# Patient Record
Sex: Male | Born: 1970 | Race: Black or African American | Hispanic: No | Marital: Single | Smoking: Former smoker
Health system: Southern US, Community
[De-identification: ages and names within clinical notes are randomized; demographics above are authoritative.]

## PROBLEM LIST (undated history)

## (undated) DIAGNOSIS — E119 Type 2 diabetes mellitus without complications: Secondary | ICD-10-CM

---

## 2020-09-06 ENCOUNTER — Other Ambulatory Visit: Payer: Self-pay

## 2020-09-06 ENCOUNTER — Encounter (HOSPITAL_COMMUNITY): Payer: Self-pay | Admitting: *Deleted

## 2020-09-06 ENCOUNTER — Inpatient Hospital Stay (HOSPITAL_COMMUNITY)
Admission: EM | Admit: 2020-09-06 | Discharge: 2020-09-08 | DRG: 638 | Disposition: A | Discharge status: Home / Self Care | Attending: Internal Medicine | Admitting: Internal Medicine

## 2020-09-06 DIAGNOSIS — Z7984 Long term (current) use of oral hypoglycemic drugs: Secondary | ICD-10-CM

## 2020-09-06 DIAGNOSIS — E1110 Type 2 diabetes mellitus with ketoacidosis without coma: Secondary | ICD-10-CM | POA: Diagnosis not present

## 2020-09-06 DIAGNOSIS — N1832 Chronic kidney disease, stage 3b: Secondary | ICD-10-CM | POA: Diagnosis present

## 2020-09-06 DIAGNOSIS — E1122 Type 2 diabetes mellitus with diabetic chronic kidney disease: Secondary | ICD-10-CM | POA: Diagnosis present

## 2020-09-06 DIAGNOSIS — R112 Nausea with vomiting, unspecified: Secondary | ICD-10-CM | POA: Diagnosis not present

## 2020-09-06 DIAGNOSIS — N179 Acute kidney failure, unspecified: Principal | ICD-10-CM | POA: Diagnosis present

## 2020-09-06 DIAGNOSIS — I10 Essential (primary) hypertension: Secondary | ICD-10-CM | POA: Diagnosis not present

## 2020-09-06 DIAGNOSIS — E1165 Type 2 diabetes mellitus with hyperglycemia: Secondary | ICD-10-CM

## 2020-09-06 DIAGNOSIS — F32A Depression, unspecified: Secondary | ICD-10-CM | POA: Diagnosis present

## 2020-09-06 DIAGNOSIS — I129 Hypertensive chronic kidney disease with stage 1 through stage 4 chronic kidney disease, or unspecified chronic kidney disease: Secondary | ICD-10-CM | POA: Diagnosis present

## 2020-09-06 DIAGNOSIS — E785 Hyperlipidemia, unspecified: Secondary | ICD-10-CM | POA: Diagnosis present

## 2020-09-06 DIAGNOSIS — R739 Hyperglycemia, unspecified: Secondary | ICD-10-CM

## 2020-09-06 DIAGNOSIS — Z8249 Family history of ischemic heart disease and other diseases of the circulatory system: Secondary | ICD-10-CM

## 2020-09-06 DIAGNOSIS — N184 Chronic kidney disease, stage 4 (severe): Secondary | ICD-10-CM | POA: Diagnosis not present

## 2020-09-06 DIAGNOSIS — E11649 Type 2 diabetes mellitus with hypoglycemia without coma: Secondary | ICD-10-CM | POA: Diagnosis not present

## 2020-09-06 DIAGNOSIS — Z7982 Long term (current) use of aspirin: Secondary | ICD-10-CM

## 2020-09-06 DIAGNOSIS — E869 Volume depletion, unspecified: Secondary | ICD-10-CM | POA: Diagnosis not present

## 2020-09-06 DIAGNOSIS — Z87891 Personal history of nicotine dependence: Secondary | ICD-10-CM

## 2020-09-06 DIAGNOSIS — K219 Gastro-esophageal reflux disease without esophagitis: Secondary | ICD-10-CM | POA: Diagnosis present

## 2020-09-06 DIAGNOSIS — Z20822 Contact with and (suspected) exposure to covid-19: Secondary | ICD-10-CM | POA: Diagnosis present

## 2020-09-06 DIAGNOSIS — Z79899 Other long term (current) drug therapy: Secondary | ICD-10-CM

## 2020-09-06 DIAGNOSIS — F419 Anxiety disorder, unspecified: Secondary | ICD-10-CM | POA: Diagnosis present

## 2020-09-06 DIAGNOSIS — E871 Hypo-osmolality and hyponatremia: Secondary | ICD-10-CM | POA: Diagnosis present

## 2020-09-06 HISTORY — DX: Type 2 diabetes mellitus without complications: E11.9

## 2020-09-06 LAB — URINALYSIS, ROUTINE W REFLEX MICROSCOPIC
Bacteria, UA: NONE SEEN
Bilirubin Urine: NEGATIVE
Glucose, UA: >=500 mg/dL — AB
Ketones, ur: 5 mg/dL — AB
Leukocytes,Ua: NEGATIVE
Nitrite: NEGATIVE
Protein, ur: 30 mg/dL — AB
Specific Gravity, Urine: 1.012 (ref 1.005–1.030)
pH: 5 (ref 5.0–8.0)

## 2020-09-06 LAB — BASIC METABOLIC PANEL
Anion gap: 14 (ref 5–15)
BUN: 48 mg/dL — ABNORMAL HIGH (ref 6–20)
CO2: 24 mmol/L (ref 22–32)
Calcium: 9.8 mg/dL (ref 8.9–10.3)
Chloride: 93 mmol/L — ABNORMAL LOW (ref 98–111)
Creatinine, Ser: 3.18 mg/dL — ABNORMAL HIGH (ref 0.61–1.24)
GFR, Estimated: 23 mL/min — ABNORMAL LOW (ref >60)
Glucose, Bld: 197 mg/dL — ABNORMAL HIGH (ref 70–99)
Potassium: 4.3 mmol/L (ref 3.5–5.1)
Sodium: 131 mmol/L — ABNORMAL LOW (ref 135–145)

## 2020-09-06 LAB — COMPREHENSIVE METABOLIC PANEL
ALT: 14 U/L (ref 0–44)
AST: 19 U/L (ref 15–41)
Albumin: 5.4 g/dL — ABNORMAL HIGH (ref 3.5–5.0)
Alkaline Phosphatase: 92 U/L (ref 38–126)
Anion gap: 20 — ABNORMAL HIGH (ref 5–15)
BUN: 55 mg/dL — ABNORMAL HIGH (ref 6–20)
CO2: 21 mmol/L — ABNORMAL LOW (ref 22–32)
Calcium: 10.1 mg/dL (ref 8.9–10.3)
Chloride: 87 mmol/L — ABNORMAL LOW (ref 98–111)
Creatinine, Ser: 3.61 mg/dL — ABNORMAL HIGH (ref 0.61–1.24)
GFR, Estimated: 20 mL/min — ABNORMAL LOW (ref >60)
Glucose, Bld: 323 mg/dL — ABNORMAL HIGH (ref 70–99)
Potassium: 4.1 mmol/L (ref 3.5–5.1)
Sodium: 128 mmol/L — ABNORMAL LOW (ref 135–145)
Total Bilirubin: 1.1 mg/dL (ref 0.3–1.2)
Total Protein: 9.2 g/dL — ABNORMAL HIGH (ref 6.5–8.1)

## 2020-09-06 LAB — BLOOD GAS, VENOUS
Acid-Base Excess: 1.4 mmol/L (ref 0.0–2.0)
Bicarbonate: 24.3 mmol/L (ref 20.0–28.0)
FIO2: 21
O2 Saturation: 44.7 %
Patient temperature: 37
pCO2, Ven: 42.3 mmHg — ABNORMAL LOW (ref 44.0–60.0)
pH, Ven: 7.401 (ref 7.250–7.430)
pO2, Ven: <31 mmHg — CL (ref 32.0–45.0)

## 2020-09-06 LAB — CBC
HCT: 40.4 % (ref 39.0–52.0)
HCT: 41 % (ref 39.0–52.0)
Hemoglobin: 14.3 g/dL (ref 13.0–17.0)
Hemoglobin: 13.9 g/dL (ref 13.0–17.0)
MCH: 29.9 pg (ref 26.0–34.0)
MCH: 29.6 pg (ref 26.0–34.0)
MCHC: 35.4 g/dL (ref 30.0–36.0)
MCHC: 33.9 g/dL (ref 30.0–36.0)
MCV: 84.3 fL (ref 80.0–100.0)
MCV: 87.2 fL (ref 80.0–100.0)
Platelets: 337 10*3/uL (ref 150–400)
Platelets: 299 10*3/uL (ref 150–400)
RBC: 4.79 MIL/uL (ref 4.22–5.81)
RBC: 4.7 MIL/uL (ref 4.22–5.81)
RDW: 12 % (ref 11.5–15.5)
RDW: 12 % (ref 11.5–15.5)
WBC: 8.7 10*3/uL (ref 4.0–10.5)
WBC: 9.3 10*3/uL (ref 4.0–10.5)
nRBC: 0 % (ref 0.0–0.2)
nRBC: 0 % (ref 0.0–0.2)

## 2020-09-06 LAB — CBG MONITORING, ED
Glucose-Capillary: 122 mg/dL — ABNORMAL HIGH (ref 70–99)
Glucose-Capillary: 147 mg/dL — ABNORMAL HIGH (ref 70–99)
Glucose-Capillary: 153 mg/dL — ABNORMAL HIGH (ref 70–99)
Glucose-Capillary: 155 mg/dL — ABNORMAL HIGH (ref 70–99)
Glucose-Capillary: 209 mg/dL — ABNORMAL HIGH (ref 70–99)
Glucose-Capillary: 341 mg/dL — ABNORMAL HIGH (ref 70–99)

## 2020-09-06 LAB — RESP PANEL BY RT-PCR (FLU A&B, COVID) ARPGX2
Influenza A by PCR: NEGATIVE
Influenza B by PCR: NEGATIVE
SARS Coronavirus 2 by RT PCR: NEGATIVE

## 2020-09-06 LAB — BETA-HYDROXYBUTYRIC ACID
Beta-Hydroxybutyric Acid: 2.07 mmol/L — ABNORMAL HIGH (ref 0.05–0.27)
Beta-Hydroxybutyric Acid: 1.35 mmol/L — ABNORMAL HIGH (ref 0.05–0.27)

## 2020-09-06 LAB — LIPASE, BLOOD: Lipase: 35 U/L (ref 11–51)

## 2020-09-06 MED ORDER — PANTOPRAZOLE SODIUM 40 MG PO TBEC: 40.0000 mg | DELAYED_RELEASE_TABLET | Freq: Every day | ORAL | Status: DC

## 2020-09-06 MED ORDER — DEXTROSE 50 % IV SOLN: 0.0000 mL | INTRAVENOUS | Status: DC | PRN

## 2020-09-06 MED ORDER — DULOXETINE HCL 20 MG PO CPEP
20.0000 mg | ORAL_CAPSULE | Freq: Every day | ORAL | Status: DC
  Administered 2020-09-06 – 2020-09-07 (×2): 20 mg via ORAL
  Filled 2020-09-06 (×2): qty 1

## 2020-09-06 MED ORDER — SODIUM CHLORIDE 0.9% FLUSH
3.0000 mL | Freq: Two times a day (BID) | INTRAVENOUS | Status: DC
  Administered 2020-09-06: 23:00:00 3 mL via INTRAVENOUS

## 2020-09-06 MED ORDER — SODIUM CHLORIDE 0.9 % IV BOLUS
1000.0000 mL | Freq: Once | INTRAVENOUS | Status: AC
  Administered 2020-09-06: 15:00:00 1000 mL via INTRAVENOUS

## 2020-09-06 MED ORDER — ONDANSETRON HCL 4 MG/2ML IJ SOLN
4.0000 mg | Freq: Once | INTRAMUSCULAR | Status: AC
  Administered 2020-09-06: 15:00:00 4 mg via INTRAVENOUS
  Filled 2020-09-06: qty 2

## 2020-09-06 MED ORDER — ACETAMINOPHEN 650 MG RE SUPP: 650.0000 mg | Freq: Four times a day (QID) | RECTAL | Status: DC | PRN

## 2020-09-06 MED ORDER — ASPIRIN EC 81 MG PO TBEC
81.0000 mg | DELAYED_RELEASE_TABLET | Freq: Every day | ORAL | Status: DC
  Administered 2020-09-07 – 2020-09-08 (×2): 81 mg via ORAL
  Filled 2020-09-06 (×2): qty 1

## 2020-09-06 MED ORDER — ONDANSETRON HCL 4 MG/2ML IJ SOLN
4.0000 mg | Freq: Once | INTRAMUSCULAR | Status: AC
  Administered 2020-09-06: 18:00:00 4 mg via INTRAVENOUS
  Filled 2020-09-06: qty 2

## 2020-09-06 MED ORDER — POTASSIUM CHLORIDE 10 MEQ/100ML IV SOLN
10.0000 meq | INTRAVENOUS | Status: AC
  Administered 2020-09-06 (×2): 10 meq via INTRAVENOUS
  Filled 2020-09-06 (×2): qty 100

## 2020-09-06 MED ORDER — INSULIN REGULAR(HUMAN) IN NACL 100-0.9 UT/100ML-% IV SOLN
INTRAVENOUS | Status: DC
  Administered 2020-09-06: 5 [IU]/h via INTRAVENOUS
  Filled 2020-09-06: qty 100

## 2020-09-06 MED ORDER — SODIUM CHLORIDE 0.9% FLUSH: 3.0000 mL | INTRAVENOUS | Status: DC | PRN

## 2020-09-06 MED ORDER — SODIUM CHLORIDE 0.9% FLUSH
3.0000 mL | Freq: Two times a day (BID) | INTRAVENOUS | Status: DC
  Administered 2020-09-06 – 2020-09-07 (×2): 3 mL via INTRAVENOUS

## 2020-09-06 MED ORDER — ACETAMINOPHEN 325 MG PO TABS: 650.0000 mg | ORAL_TABLET | Freq: Four times a day (QID) | ORAL | Status: DC | PRN

## 2020-09-06 MED ORDER — SIMVASTATIN 20 MG PO TABS
40.0000 mg | ORAL_TABLET | Freq: Every day | ORAL | Status: DC
  Administered 2020-09-06 – 2020-09-07 (×2): 40 mg via ORAL
  Filled 2020-09-06: qty 4
  Filled 2020-09-06: qty 2

## 2020-09-06 MED ORDER — DEXTROSE IN LACTATED RINGERS 5 % IV SOLN: INTRAVENOUS | Status: DC

## 2020-09-06 MED ORDER — SENNOSIDES-DOCUSATE SODIUM 8.6-50 MG PO TABS
2.0000 | ORAL_TABLET | Freq: Every day | ORAL | Status: DC
  Administered 2020-09-07: 21:00:00 2 via ORAL
  Filled 2020-09-06: qty 2

## 2020-09-06 MED ORDER — PROMETHAZINE HCL 25 MG/ML IJ SOLN
12.5000 mg | Freq: Once | INTRAMUSCULAR | Status: AC
  Administered 2020-09-06: 19:00:00 12.5 mg via INTRAVENOUS
  Filled 2020-09-06: qty 1

## 2020-09-06 MED ORDER — BISACODYL 10 MG RE SUPP: 10.0000 mg | Freq: Every day | RECTAL | Status: DC | PRN

## 2020-09-06 MED ORDER — SODIUM CHLORIDE 0.9 % IV BOLUS
1000.0000 mL | Freq: Once | INTRAVENOUS | Status: AC
  Administered 2020-09-06: 19:00:00 1000 mL via INTRAVENOUS

## 2020-09-06 MED ORDER — AMLODIPINE BESYLATE 5 MG PO TABS
10.0000 mg | ORAL_TABLET | Freq: Every day | ORAL | Status: DC
  Administered 2020-09-07 – 2020-09-08 (×2): 10 mg via ORAL
  Filled 2020-09-06 (×2): qty 2

## 2020-09-06 MED ORDER — HEPARIN SODIUM (PORCINE) 5000 UNIT/ML IJ SOLN
5000.0000 [IU] | Freq: Three times a day (TID) | INTRAMUSCULAR | Status: DC
  Administered 2020-09-06 – 2020-09-08 (×5): 5000 [IU] via SUBCUTANEOUS
  Filled 2020-09-06 (×5): qty 1

## 2020-09-06 MED ORDER — POLYETHYLENE GLYCOL 3350 17 G PO PACK: 17.0000 g | PACK | Freq: Every day | ORAL | Status: DC | PRN

## 2020-09-06 MED ORDER — ONDANSETRON HCL 4 MG/2ML IJ SOLN
4.0000 mg | Freq: Four times a day (QID) | INTRAMUSCULAR | Status: DC | PRN
  Administered 2020-09-07: 03:00:00 4 mg via INTRAVENOUS
  Filled 2020-09-06: qty 2

## 2020-09-06 MED ORDER — SODIUM CHLORIDE 0.9 % IV SOLN: 250.0000 mL | INTRAVENOUS | Status: DC | PRN

## 2020-09-06 MED ORDER — ONDANSETRON HCL 4 MG PO TABS: 4.0000 mg | ORAL_TABLET | Freq: Four times a day (QID) | ORAL | Status: DC | PRN

## 2020-09-06 MED ORDER — HEPARIN SODIUM (PORCINE) 5000 UNIT/ML IJ SOLN: 5000.0000 [IU] | Freq: Three times a day (TID) | INTRAMUSCULAR | Status: DC

## 2020-09-06 MED ORDER — LACTATED RINGERS IV SOLN: INTRAVENOUS | Status: DC

## 2020-09-06 NOTE — ED Provider Notes (Addendum)
[facility] EMERGENCY DEPARTMENT Provider Note   CSN: [id number] Arrival date & time: 09/06/20  1039     History Chief Complaint  Patient presents with  . Emesis    Jesse Carey is a 49 y.o. male  with a history of non insulin dependent DM, HTN, hypercholesterolemia and GERD presenting a one week history of uncontrolled nausea and vomiting with increased weakness and intermittent chills.  He denies chest pain, sob, cough, also no abdominal pain and no diarrhea.  He was tested for Covid by the prison RN prior to transport here, pt stating this test was negative.  He endorses having taken his prescribed medications but his blood glucose levels have not been well controlled, as high as 500 this past week. He endorses increased urinary frequency, dry mouth, thirst, but has had very little urine production today.  No history of kidney problems.   He has found no alleviators for his symptoms.  HPI     Past Medical History:  Diagnosis Date  . Diabetes mellitus without complication ([place])     There are no problems to display for this patient.   History reviewed. No pertinent surgical history.     History reviewed. No pertinent family history.  Social History   Tobacco Use  . Smoking status: Former Research scientist (life sciences)  . Smokeless tobacco: Never Used    Home Medications Prior to Admission medications   Medication Sig Start Date End Date Taking? Authorizing Provider  amLODipine (NORVASC) 10 MG tablet Take 10 mg by mouth daily.   Yes [provider]  aspirin EC 81 MG tablet Take 81 mg by mouth daily. Swallow whole.   Yes [provider]  Calcium Polycarbophil (FIBER) 625 MG TABS Take 625 mg by mouth daily. With 2 glasses of water   Yes [provider]  chlorthalidone (HYGROTON) 25 MG tablet Take 25 mg by mouth daily.   Yes [provider]  docusate sodium (COLACE) 100 MG capsule Take 100 mg by mouth 2 (two) times daily.   Yes [provider]   DULoxetine (CYMBALTA) 20 MG capsule Take 20 mg by mouth at bedtime.   Yes [provider]  glipiZIDE (GLUCOTROL XL) 10 MG 24 hr tablet Take 10 mg by mouth daily.   Yes [provider]  losartan (COZAAR) 25 MG tablet Take 25 mg by mouth daily.   Yes [provider]  metFORMIN (GLUCOPHAGE-XR) 750 MG 24 hr tablet Take 750 mg by mouth in the morning and at bedtime.   Yes [provider]  omeprazole (PRILOSEC) 20 MG capsule Take 20 mg by mouth at bedtime.   Yes [provider]  simvastatin (ZOCOR) 40 MG tablet Take 40 mg by mouth at bedtime.   Yes [provider]  terbinafine (LAMISIL) 250 MG tablet Take 250 mg by mouth daily.   Yes [provider]    Allergies    Patient has no known allergies.  Review of Systems   Review of Systems  Constitutional: Positive for chills and fatigue. Negative for fever.  HENT: Negative for congestion.   Eyes: Negative.   Respiratory: Negative for chest tightness and shortness of breath.   Cardiovascular: Negative for chest pain.  Gastrointestinal: Positive for nausea and vomiting. Negative for abdominal pain.  Genitourinary: Positive for decreased urine volume.  Musculoskeletal: Negative for arthralgias, joint swelling and neck pain.  Skin: Negative.  Negative for rash and wound.  Neurological: Negative for dizziness, weakness, light-headedness, numbness and headaches.  Psychiatric/Behavioral: Negative.  Physical Exam Updated Vital Signs BP 132/85   Pulse 99   Temp 98.6 F (37 C) (Oral)   Resp (!) 23   Ht 5\' 8"  (1.727 m)   Wt 80.3 kg   SpO2 97%   BMI 26.91 kg/m   Physical Exam Vitals and nursing note reviewed.  Constitutional:      Appearance: He is well-developed and well-nourished.  HENT:     Head: Normocephalic and atraumatic.     Mouth/Throat:     Mouth: Mucous membranes are dry.  Eyes:     Conjunctiva/sclera: Conjunctivae normal.  Cardiovascular:     Rate and Rhythm:  Normal rate and regular rhythm.     Pulses: Intact distal pulses.     Heart sounds: Normal heart sounds.  Pulmonary:     Effort: Pulmonary effort is normal.     Breath sounds: Normal breath sounds. No wheezing.  Abdominal:     General: Bowel sounds are normal.     Palpations: Abdomen is soft.     Tenderness: There is no abdominal tenderness. There is no guarding.  Musculoskeletal:        General: Normal range of motion.     Cervical back: Normal range of motion.     Right lower leg: No edema.     Left lower leg: No edema.  Skin:    General: Skin is warm and dry.  Neurological:     General: No focal deficit present.     Mental Status: He is alert.  Psychiatric:        Mood and Affect: Mood and affect normal.     ED Results / Procedures / Treatments   Labs (all labs ordered are listed, but only abnormal results are displayed) Labs Reviewed  COMPREHENSIVE METABOLIC PANEL - Abnormal; Notable for the following components:      Result Value   Sodium 128 (*)    Chloride 87 (*)    CO2 21 (*)    Glucose, Bld 323 (*)    BUN 55 (*)    Creatinine, Ser 3.61 (*)    Total Protein 9.2 (*)    Albumin 5.4 (*)    GFR, Estimated 20 (*)    Anion gap 20 (*)    All other components within normal limits  BLOOD GAS, VENOUS - Abnormal; Notable for the following components:   pCO2, Ven 42.3 (*)    pO2, Ven <31.0 (*)    All other components within normal limits  CBG MONITORING, ED - Abnormal; Notable for the following components:   Glucose-Capillary 341 (*)    All other components within normal limits  RESP PANEL BY RT-PCR (FLU A&B, COVID) ARPGX2  LIPASE, BLOOD  CBC  URINALYSIS, ROUTINE W REFLEX MICROSCOPIC  BETA-HYDROXYBUTYRIC ACID    EKG None  Radiology No results found.  Procedures Procedures (including critical care time)  Medications Ordered in ED Medications  sodium chloride 0.9 % bolus 1,000 mL (0 mLs Intravenous Stopped 09/06/20 1650)  ondansetron (ZOFRAN) injection 4  mg (4 mg Intravenous Given 09/06/20 1518)  ondansetron (ZOFRAN) injection 4 mg (4 mg Intravenous Given 09/06/20 1807)    ED Course  I have reviewed the triage vital signs and the nursing notes.  Pertinent labs & imaging results that were available during my care of the patient were reviewed by me and considered in my medical decision making (see chart for details).    MDM Rules/Calculators/A&P  Pt given IV fluids, pending vbg and beta hydroxybutryric to differential dka.  vbg with a normal pH,  Borderline low CO2,  Not dka.  Significant ARF with creatinine of 3.61, no prior to compare.  BUN 55 suggesting dehydration. Pt will require admission.  Corrected Na+ 132, mild hyponatremia  6:35 PM   cbg improved at 209. Still unable to urinate, states has not urinated since yesterday.  Bladder scan 830 mL, pt is making urine. Additional IV NS ordered.  He provided additional info stating he has a "shy bladder".  Pt admitted to hospitalist service, Dr. Denton Brick   Final Clinical Impression(s) / ED Diagnoses Final diagnoses:  Acute renal failure, unspecified acute renal failure type ([place])  Nausea and vomiting, intractability of vomiting not specified, unspecified vomiting type  Hyperglycemia    Rx / DC Orders ED Discharge Orders    None       Landis Martins 09/06/20 Moody Bruins, PA-C 09/06/20 1849    Truddie Hidden, MD 09/07/20 0701    Evalee Jefferson, PA-C 09/07/20 1207    Truddie Hidden, MD 09/07/20 1343

## 2020-09-06 NOTE — ED Notes (Signed)
Pt unable to give urine sample at this time 

## 2020-09-06 NOTE — ED Notes (Signed)
Pt still unable to give urine sample at this time.

## 2020-09-06 NOTE — H&P (Signed)
Patient Demographics:    Jesse Carey, is a 49 y.o. male  MRN: [id number]   DOB - 1971-07-03  Admit Date - 09/06/2020  Outpatient Primary MD for the patient is Pcp, No   Assessment & Plan:    Principal Problem:   DKA (diabetic ketoacidosis) ([place]) Active Problems:   CKD (chronic kidney disease), stage IV ([facility])   Hyponatremia   HTN (hypertension)   GERD (gastroesophageal reflux disease)   HLD (hyperlipidemia)   A/p 1)Hyperglycemia with anion gap metabolic acidosis suspect some component of diabetic Ketoacidosis ---  Beta-Hydroxybutyric Acid is 2.0 Glucose 341 Bicarb 21, AG is 20 VBG noted -Continue IV fluids and IV insulin per Endo tool protocol -Diabetic educator consult requested -A1c pending  2)Abd Pain/Nausea/Dry Heaves---  Lipase 35, suspect DKA related --Trial of oral intake -As needed antiemetics and PPI as ordered  3)CKD IV Vs Aki on CKD --- Creatinine is 3.61, no prior records/prior creatinine to evaluate -Patient has been diabetic since 2013, had longstanding HTN I suspect some component of CKD -Hold losartan and chlorthalidone due to kidney concerns  4) hyponatremia--- sodium is 128, suspect due to dehydration in setting of nausea, dry heaves and poor oral intake compounded by chlorthalidone use --- patient received normal saline in the ED -Stop chlorthalidone -Avoid dehydration, continue to hydrate  5)HTN--- -Hold losartan and chlorthalidone due to kidney concerns -Continue amlodipine 10 mg daily  6)HLD-continue simvastatin  7)GERD--- continue PPI  8) social--- patient is from correctional facility, patient has been at correctional facility for 49 years not since age 31, apparently scheduled to be released in 3 and half years when he is 49 years old--- he had a 20-year  sentence  Disposition/Need for in-Hospital Stay- patient unable to be discharged at this time due to --- hyperglycemia with anion gap metabolic acidosis with inability to take oral intake requiring IV fluids and IV insulin  Dispo: The patient is from: Correctional facility              Anticipated d/c is to: Correctional facility              Anticipated d/c date is: 1 day              Patient currently is not medically stable to d/c. Barriers: Not Clinically Stable-    With History of - Reviewed by me  Past Medical History:  Diagnosis Date  . Diabetes mellitus without complication ([place])       History reviewed. No pertinent surgical history.    Chief Complaint  Patient presents with  . Emesis      HPI:    Jesse Carey  is a 49 y.o. male with past medical history relevant for HTN, GERD and HLD, as well as diabetes mellitus diagnosed back in 2013, presumed CKD unknown stage who presents from correctional facility with complaints of generalized malaise, fatigue, generalized weakness, nausea with dry heaving over the last 5 to  7 days -No diarrhea, no sick contacts -No fevers no chills no palpitations no chest pains no shortness of breath no dizziness no pleuritic symptoms no leg pains -No cough -Patient reports compliance with Metformin and glipizide PTA -Patient denies recent change in his diet or habits  --Designer, industrial/product at bedside- - In ED Labs--reflect anion gap metabolic acidosis suspect some component of diabetic Ketoacidosis ---  Beta-Hydroxybutyric Acid is 2.0 Glucose 341 Bicarb 21, AG is 20 VBG noted  Lipase 35, --- Creatinine is 3.61 -Sodium is low at 128  -EDP gave IV fluids, -IV insulin via Endo tool initiated in the ED -   Review of systems:    In addition to the HPI above,   A full Review of  Systems was done, all other systems reviewed are negative except as noted above in HPI , .   Social History:  Reviewed by me    Social History    Tobacco Use  . Smoking status: Former Research scientist (life sciences)  . Smokeless tobacco: Never Used  Substance Use Topics  . Alcohol use: Not on file     Family History :  Reviewed by me  HTN   Home Medications:   Prior to Admission medications   Medication Sig Start Date End Date Taking? Authorizing Provider  amLODipine (NORVASC) 10 MG tablet Take 10 mg by mouth daily.   Yes [provider]  aspirin EC 81 MG tablet Take 81 mg by mouth daily. Swallow whole.   Yes [provider]  Calcium Polycarbophil (FIBER) 625 MG TABS Take 625 mg by mouth daily. With 2 glasses of water   Yes [provider]  chlorthalidone (HYGROTON) 25 MG tablet Take 25 mg by mouth daily.   Yes [provider]  docusate sodium (COLACE) 100 MG capsule Take 100 mg by mouth 2 (two) times daily.   Yes [provider]  DULoxetine (CYMBALTA) 20 MG capsule Take 20 mg by mouth at bedtime.   Yes [provider]  glipiZIDE (GLUCOTROL XL) 10 MG 24 hr tablet Take 10 mg by mouth daily.   Yes [provider]  losartan (COZAAR) 25 MG tablet Take 25 mg by mouth daily.   Yes [provider]  metFORMIN (GLUCOPHAGE-XR) 750 MG 24 hr tablet Take 750 mg by mouth in the morning and at bedtime.   Yes [provider]  omeprazole (PRILOSEC) 20 MG capsule Take 20 mg by mouth at bedtime.   Yes [provider]  simvastatin (ZOCOR) 40 MG tablet Take 40 mg by mouth at bedtime.   Yes [provider]  terbinafine (LAMISIL) 250 MG tablet Take 250 mg by mouth daily.   Yes [provider]     Allergies:    No Known Allergies   Physical Exam:   Vitals  Blood pressure (!) 147/91, pulse 94, temperature 98.6 F (37 C), temperature source Oral, resp. rate 16, height 5\' 8"  (1.727 m), weight 80.3 kg, SpO2 97 %.  Physical Examination: General appearance - alert, and in no distress  Mental status - alert, oriented to person, place, and time,  Eyes - sclera  anicteric Neck - supple, no JVD elevation , Chest - clear  to auscultation bilaterally, symmetrical air movement,  Heart - S1 and S2 normal, regular  Abdomen - soft, nontender, nondistended, no masses or organomegaly Neurological - screening mental status exam normal, neck supple without rigidity, cranial nerves II through XII intact, DTR's normal and symmetric Extremities - no pedal edema noted, intact  peripheral pulses Skin - warm, dry    Data Review:    CBC Recent Labs  Lab 09/06/20 1158  WBC 8.7  HGB 14.3  HCT 40.4  PLT 337  MCV 84.3  MCH 29.9  MCHC 35.4  RDW 12.0    Chemistries  Recent Labs  Lab 09/06/20 1158  NA 128*  K 4.1  CL 87*  CO2 21*  GLUCOSE 323*  BUN 55*  CREATININE 3.61*  CALCIUM 10.1  AST 19  ALT 14  ALKPHOS 92  BILITOT 1.1   ------------------------------------------------------------------------------------------------------------------ estimated creatinine clearance is 23.7 mL/min (A) (by C-G formula based on SCr of 3.61 mg/dL (H)). ------------------------------------------------------------------------------------------------------------------ No results for input(s): TSH, T4TOTAL, T3FREE, THYROIDAB in the last 72 hours.  Invalid input(s): FREET3   Coagulation profile No results for input(s): INR, PROTIME in the last 168 hours. ------------------------------------------------------------------------------------------------------------------- No results for input(s): DDIMER in the last 72 hours. -------------------------------------------------------------------------------------------------------------------  Cardiac Enzymes No results for input(s): CKMB, TROPONINI, MYOGLOBIN in the last 168 hours.  Invalid input(s): CK ------------------------------------------------------------------------------------------------------------------ No results found for:  BNP   ---------------------------------------------------------------------------------------------------------------  Urinalysis No results found for: COLORURINE, APPEARANCEUR, LABSPEC, [place], GLUCOSEU, HGBUR, BILIRUBINUR, KETONESUR, PROTEINUR, UROBILINOGEN, NITRITE, LEUKOCYTESUR  ----------------------------------------------------------------------------------------------------------------   Imaging Results:    No results found.  Radiological Exams on Admission: No results found.  DVT Prophylaxis -SCD/heparin AM Labs Ordered, also please review Full Orders  Family Communication: Admission, patients condition and plan of care including tests being ordered have been discussed with the patient who indicate understanding and agree with the plan   Code Status - Full Code  Likely DC to back to correctional facility  Condition   stable  Roxan Hockey M.D on 09/06/2020 at 7:24 PM Go to [email or web address] -  for contact info  [facility] - Office  [phone]

## 2020-09-06 NOTE — ED Notes (Signed)
Call from lab   Critical Venous PO2 of less than 31.0   JI, [place] informed

## 2020-09-06 NOTE — ED Triage Notes (Signed)
Pt c/o chills, emesis and gen. Weakness since last Friday.  Unknown of any fevers.

## 2020-09-07 ENCOUNTER — Observation Stay (HOSPITAL_COMMUNITY)

## 2020-09-07 DIAGNOSIS — Z7984 Long term (current) use of oral hypoglycemic drugs: Secondary | ICD-10-CM | POA: Diagnosis not present

## 2020-09-07 DIAGNOSIS — E871 Hypo-osmolality and hyponatremia: Secondary | ICD-10-CM

## 2020-09-07 DIAGNOSIS — N1832 Chronic kidney disease, stage 3b: Secondary | ICD-10-CM

## 2020-09-07 DIAGNOSIS — E1110 Type 2 diabetes mellitus with ketoacidosis without coma: Secondary | ICD-10-CM | POA: Diagnosis present

## 2020-09-07 DIAGNOSIS — E785 Hyperlipidemia, unspecified: Secondary | ICD-10-CM | POA: Diagnosis present

## 2020-09-07 DIAGNOSIS — Z8249 Family history of ischemic heart disease and other diseases of the circulatory system: Secondary | ICD-10-CM | POA: Diagnosis not present

## 2020-09-07 DIAGNOSIS — N179 Acute kidney failure, unspecified: Secondary | ICD-10-CM

## 2020-09-07 DIAGNOSIS — Z7982 Long term (current) use of aspirin: Secondary | ICD-10-CM | POA: Diagnosis not present

## 2020-09-07 DIAGNOSIS — F32A Depression, unspecified: Secondary | ICD-10-CM | POA: Diagnosis present

## 2020-09-07 DIAGNOSIS — E1122 Type 2 diabetes mellitus with diabetic chronic kidney disease: Secondary | ICD-10-CM | POA: Diagnosis present

## 2020-09-07 DIAGNOSIS — Z79899 Other long term (current) drug therapy: Secondary | ICD-10-CM | POA: Diagnosis not present

## 2020-09-07 DIAGNOSIS — Z20822 Contact with and (suspected) exposure to covid-19: Secondary | ICD-10-CM | POA: Diagnosis present

## 2020-09-07 DIAGNOSIS — E1165 Type 2 diabetes mellitus with hyperglycemia: Secondary | ICD-10-CM | POA: Diagnosis not present

## 2020-09-07 DIAGNOSIS — F419 Anxiety disorder, unspecified: Secondary | ICD-10-CM | POA: Diagnosis present

## 2020-09-07 DIAGNOSIS — Z87891 Personal history of nicotine dependence: Secondary | ICD-10-CM | POA: Diagnosis not present

## 2020-09-07 DIAGNOSIS — E11649 Type 2 diabetes mellitus with hypoglycemia without coma: Secondary | ICD-10-CM | POA: Diagnosis not present

## 2020-09-07 DIAGNOSIS — E782 Mixed hyperlipidemia: Secondary | ICD-10-CM | POA: Diagnosis not present

## 2020-09-07 DIAGNOSIS — I129 Hypertensive chronic kidney disease with stage 1 through stage 4 chronic kidney disease, or unspecified chronic kidney disease: Secondary | ICD-10-CM | POA: Diagnosis present

## 2020-09-07 DIAGNOSIS — K219 Gastro-esophageal reflux disease without esophagitis: Secondary | ICD-10-CM | POA: Diagnosis present

## 2020-09-07 DIAGNOSIS — E869 Volume depletion, unspecified: Secondary | ICD-10-CM | POA: Diagnosis not present

## 2020-09-07 DIAGNOSIS — R112 Nausea with vomiting, unspecified: Secondary | ICD-10-CM | POA: Diagnosis present

## 2020-09-07 LAB — BASIC METABOLIC PANEL
Anion gap: 12 (ref 5–15)
Anion gap: 11 (ref 5–15)
Anion gap: 12 (ref 5–15)
BUN: 44 mg/dL — ABNORMAL HIGH (ref 6–20)
BUN: 36 mg/dL — ABNORMAL HIGH (ref 6–20)
BUN: 32 mg/dL — ABNORMAL HIGH (ref 6–20)
CO2: 27 mmol/L (ref 22–32)
CO2: 25 mmol/L (ref 22–32)
CO2: 26 mmol/L (ref 22–32)
Calcium: 9.6 mg/dL (ref 8.9–10.3)
Calcium: 9.8 mg/dL (ref 8.9–10.3)
Calcium: 9.5 mg/dL (ref 8.9–10.3)
Chloride: 95 mmol/L — ABNORMAL LOW (ref 98–111)
Chloride: 97 mmol/L — ABNORMAL LOW (ref 98–111)
Chloride: 95 mmol/L — ABNORMAL LOW (ref 98–111)
Creatinine, Ser: 2.93 mg/dL — ABNORMAL HIGH (ref 0.61–1.24)
Creatinine, Ser: 2.65 mg/dL — ABNORMAL HIGH (ref 0.61–1.24)
Creatinine, Ser: 2.54 mg/dL — ABNORMAL HIGH (ref 0.61–1.24)
GFR, Estimated: 25 mL/min — ABNORMAL LOW (ref >60)
GFR, Estimated: 28 mL/min — ABNORMAL LOW (ref >60)
GFR, Estimated: 30 mL/min — ABNORMAL LOW (ref >60)
Glucose, Bld: 144 mg/dL — ABNORMAL HIGH (ref 70–99)
Glucose, Bld: 179 mg/dL — ABNORMAL HIGH (ref 70–99)
Glucose, Bld: 174 mg/dL — ABNORMAL HIGH (ref 70–99)
Potassium: 4 mmol/L (ref 3.5–5.1)
Potassium: 3.9 mmol/L (ref 3.5–5.1)
Potassium: 3.9 mmol/L (ref 3.5–5.1)
Sodium: 134 mmol/L — ABNORMAL LOW (ref 135–145)
Sodium: 133 mmol/L — ABNORMAL LOW (ref 135–145)
Sodium: 133 mmol/L — ABNORMAL LOW (ref 135–145)

## 2020-09-07 LAB — COMPREHENSIVE METABOLIC PANEL
ALT: 12 U/L (ref 0–44)
AST: 18 U/L (ref 15–41)
Albumin: 4.6 g/dL (ref 3.5–5.0)
Alkaline Phosphatase: 80 U/L (ref 38–126)
Anion gap: 11 (ref 5–15)
BUN: 40 mg/dL — ABNORMAL HIGH (ref 6–20)
CO2: 27 mmol/L (ref 22–32)
Calcium: 9.8 mg/dL (ref 8.9–10.3)
Chloride: 97 mmol/L — ABNORMAL LOW (ref 98–111)
Creatinine, Ser: 2.89 mg/dL — ABNORMAL HIGH (ref 0.61–1.24)
GFR, Estimated: 26 mL/min — ABNORMAL LOW (ref >60)
Glucose, Bld: 143 mg/dL — ABNORMAL HIGH (ref 70–99)
Potassium: 4 mmol/L (ref 3.5–5.1)
Sodium: 135 mmol/L (ref 135–145)
Total Bilirubin: 1.1 mg/dL (ref 0.3–1.2)
Total Protein: 7.9 g/dL (ref 6.5–8.1)

## 2020-09-07 LAB — GLUCOSE, CAPILLARY
Glucose-Capillary: 130 mg/dL — ABNORMAL HIGH (ref 70–99)
Glucose-Capillary: 137 mg/dL — ABNORMAL HIGH (ref 70–99)

## 2020-09-07 LAB — HIV ANTIBODY (ROUTINE TESTING W REFLEX): HIV Screen 4th Generation wRfx: NONREACTIVE

## 2020-09-07 LAB — LIPID PANEL
Cholesterol: 269 mg/dL — ABNORMAL HIGH (ref 0–200)
HDL: 33 mg/dL — ABNORMAL LOW (ref >40)
LDL Cholesterol: 205 mg/dL — ABNORMAL HIGH (ref 0–99)
Total CHOL/HDL Ratio: 8.2 RATIO
Triglycerides: 157 mg/dL — ABNORMAL HIGH (ref <150)
VLDL: 31 mg/dL (ref 0–40)

## 2020-09-07 LAB — CBC
HCT: 38.7 % — ABNORMAL LOW (ref 39.0–52.0)
Hemoglobin: 13.1 g/dL (ref 13.0–17.0)
MCH: 29.5 pg (ref 26.0–34.0)
MCHC: 33.9 g/dL (ref 30.0–36.0)
MCV: 87.2 fL (ref 80.0–100.0)
Platelets: 301 10*3/uL (ref 150–400)
RBC: 4.44 MIL/uL (ref 4.22–5.81)
RDW: 12.1 % (ref 11.5–15.5)
WBC: 10 10*3/uL (ref 4.0–10.5)
nRBC: 0 % (ref 0.0–0.2)

## 2020-09-07 LAB — CBG MONITORING, ED
Glucose-Capillary: 127 mg/dL — ABNORMAL HIGH (ref 70–99)
Glucose-Capillary: 140 mg/dL — ABNORMAL HIGH (ref 70–99)
Glucose-Capillary: 152 mg/dL — ABNORMAL HIGH (ref 70–99)
Glucose-Capillary: 166 mg/dL — ABNORMAL HIGH (ref 70–99)
Glucose-Capillary: 167 mg/dL — ABNORMAL HIGH (ref 70–99)
Glucose-Capillary: 180 mg/dL — ABNORMAL HIGH (ref 70–99)
Glucose-Capillary: 195 mg/dL — ABNORMAL HIGH (ref 70–99)
Glucose-Capillary: 228 mg/dL — ABNORMAL HIGH (ref 70–99)

## 2020-09-07 LAB — HEMOGLOBIN A1C
Hgb A1c MFr Bld: 10.7 % — ABNORMAL HIGH (ref 4.8–5.6)
Mean Plasma Glucose: 260.39 mg/dL

## 2020-09-07 LAB — BETA-HYDROXYBUTYRIC ACID: Beta-Hydroxybutyric Acid: 0.11 mmol/L (ref 0.05–0.27)

## 2020-09-07 MED ORDER — PANTOPRAZOLE SODIUM 40 MG PO TBEC
40.0000 mg | DELAYED_RELEASE_TABLET | Freq: Two times a day (BID) | ORAL | Status: DC
  Administered 2020-09-07 – 2020-09-08 (×2): 40 mg via ORAL
  Filled 2020-09-07 (×2): qty 1

## 2020-09-07 MED ORDER — INSULIN DETEMIR 100 UNIT/ML ~~LOC~~ SOLN
10.0000 [IU] | SUBCUTANEOUS | Status: DC
  Administered 2020-09-07 – 2020-09-08 (×2): 10 [IU] via SUBCUTANEOUS
  Filled 2020-09-07 (×5): qty 0.1

## 2020-09-07 MED ORDER — INSULIN ASPART 100 UNIT/ML ~~LOC~~ SOLN: 0.0000 [IU] | Freq: Every day | SUBCUTANEOUS | Status: DC

## 2020-09-07 MED ORDER — SODIUM CHLORIDE 0.9 % IV SOLN: INTRAVENOUS | Status: DC

## 2020-09-07 MED ORDER — INSULIN ASPART 100 UNIT/ML ~~LOC~~ SOLN
0.0000 [IU] | Freq: Three times a day (TID) | SUBCUTANEOUS | Status: DC
  Administered 2020-09-08: 2 [IU] via SUBCUTANEOUS

## 2020-09-07 MED ORDER — LIVING WELL WITH DIABETES BOOK
Freq: Once | Status: AC
  Filled 2020-09-07: qty 1

## 2020-09-07 NOTE — ED Notes (Signed)
Pt given a meal per attending MD

## 2020-09-07 NOTE — ED Notes (Signed)
1000cc of clear yellow urine emptied from urinal.

## 2020-09-07 NOTE — ED Notes (Signed)
Attempted to call report to floor, nurse states she will call me back.

## 2020-09-07 NOTE — Progress Notes (Signed)
Inpatient Diabetes Program Recommendations  AACE/ADA: New Consensus Statement on Inpatient Glycemic Control (2015)  Target Ranges:  Prepandial:   less than 140 mg/dL      Peak postprandial:   less than 180 mg/dL (1-2 hours)      Critically ill patients:  140 - 180 mg/dL   Lab Results  Component Value Date   GLUCAP 127 (H) 09/07/2020   HGBA1C 10.7 (H) 09/06/2020    Review of Glycemic Control Results for Jesse Carey, Jesse Carey (MRN [id number]) as of 09/07/2020 10:05  Ref. Range 09/07/2020 03:40 09/07/2020 05:12 09/07/2020 06:39 09/07/2020 07:50 09/07/2020 08:53  Glucose-Capillary Latest Ref Range: 70 - 99 mg/dL 180 (H) 228 (H) 195 (H) 152 (H) 127 (H)  Diabetes history: DM2 Outpatient Diabetes medications: Glucotrol XL 10 mg + Glucophage XL 750 mg bid Current orders for Inpatient glycemic control: Levemir 10 units + Novolog 0-9 units tid + 0-5 units hs  Inpatient Diabetes Program Recommendations:   Noted GFR 28 so will limit receiving Metformin @ discharge. Agree with Levemir 10 units qd   Living Well With Diabetes sent to patient. Will follow during hospitalization.  Thank you, Nani Gasser. Bettina Warn, RN, MSN, CDE  Diabetes Coordinator Inpatient Glycemic Control Team Team Pager [phone] (8am-5pm) 09/07/2020 10:15 AM

## 2020-09-07 NOTE — Progress Notes (Signed)
PROGRESS NOTE  Jesse Carey ZMO:[id number] DOB: [birth year] DOA: 09/06/2020 PCP: Pcp, No  Brief History:  49 year old male with a history of diabetes mellitus type 2, hypertension, hyperlipidemia and CKD presenting with 5 to 6-day history of generalized weakness with nausea and dry heaving.  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  He states that for the past week his sugars have been running 400-500 range.  He is states that for the past 2 weeks he has been treated for some type of " stomach condition".  He states this has improved, and he no longer has abdominal pain.  He denies any fevers, chills, chest pain, shortness breath, cough, hemoptysis, abdominal pain, dysuria, hematuria, hematochezia, melena. Upon presentation, the patient was noted to have a serum glucose of 323 with anion gap of 20.  It was noted to have ketonuria and elevated BHA.  He was started on IV insulin and IV fluids. Notably, the patient states that he was diagnosed with diabetes mellitus while he was in jail in 2013.  He has never been on insulin.  He currently is being treated with Metformin and glipizide.  He is not aware of any first-degree relatives with diabetes mellitus.  Assessment/Plan: DKA type II -patient started on IV insulin with q 1 hour CBG check and q 4 hour BMPs -pt started on aggressive fluid resuscitation -Electrolytes were monitored and repleted -transitioned to [place] insulin once anion gap closed -diet was advanced once anion gap closed -HbA1C--pending -Check C-peptide and antiinsulin antibodies  Essential hypertension -Continue amlodipine  Hyperlipidemia -Continue statin -check lipid panel  Depression/anxiety -Continue Cymbalta  Hyponatremia -Secondary to hypoglycemia and volume depletion -Continue IV fluids  Acute on chronic renal failure--CKD stage unknown  -CKD stage unknown presently -Renal ultrasound -Continue IV  fluids -Serum creatinine peaked at 3.61       Status is: Observation  The patient will remain in hospital for 2 midnights.  He remains volume depleted with electrolyte derangements with AKI  Dispo: The patient is from: correctional facility              Anticipated d/c is to: correctional facility              Anticipated d/c date is: 1 day              Patient currently is not medically stable to d/c.        Family Communication:  no Family at bedside  Consultants:  none  Code Status:  FULL  DVT Prophylaxis:  [place] Heparin   Procedures: As Listed in Progress Note Above  Antibiotics: None       Subjective: Patient denies fevers, chills, headache, chest pain, dyspnea, nausea,  diarrhea, abdominal pain, dysuria, hematuria, hematochezia, and melena.  He remains nauseous   Objective: Vitals:   09/07/20 0515 09/07/20 0530 09/07/20 0600 09/07/20 0630  BP:  (!) 163/93 (!) 146/84 (!) 148/85  Pulse: 88 89 83 87  Resp: 15 16 14 14   Temp:      TempSrc:      SpO2: 100% 98% 93% 100%  Weight:      Height:        Intake/Output Summary (Last 24 hours) at 09/07/2020 0818 Last data filed at 09/07/2020 0421 Gross per 24 hour  Intake 2116.29 ml  Output --  Net 2116.29 ml   Weight change:  Exam:   General:  Pt is alert, follows commands appropriately, not in acute distress  HEENT: No icterus, No thrush, No neck mass, [place]/AT  Cardiovascular: RRR, S1/S2, no rubs, no gallops  Respiratory: bibasilar rales. No wheeze  Abdomen: Soft/+BS, non tender, non distended, no guarding  Extremities: No edema, No lymphangitis, No petechiae, No rashes, no synovitis   Data Reviewed: I have personally reviewed following labs and imaging studies Basic Metabolic Panel: Recent Labs  Lab 09/06/20 1158 09/06/20 1922 09/06/20 2251 09/07/20 0231 09/07/20 0711  NA 128* 131* 134* 135 133*  K 4.1 4.3 4.0 4.0 3.9  CL 87* 93* 95* 97* 97*  CO2 21* 24 27 27 25   GLUCOSE 323*  197* 144* 143* 179*  BUN 55* 48* 44* 40* 36*  CREATININE 3.61* 3.18* 2.93* 2.89* 2.65*  CALCIUM 10.1 9.8 9.6 9.8 9.8   Liver Function Tests: Recent Labs  Lab 09/06/20 1158 09/07/20 0231  AST 19 18  ALT 14 12  ALKPHOS 92 80  BILITOT 1.1 1.1  PROT 9.2* 7.9  ALBUMIN 5.4* 4.6   Recent Labs  Lab 09/06/20 1158  LIPASE 35   No results for input(s): AMMONIA in the last 168 hours. Coagulation Profile: No results for input(s): INR, PROTIME in the last 168 hours. CBC: Recent Labs  Lab 09/06/20 1158 09/06/20 1922 09/07/20 0231  WBC 8.7 9.3 10.0  HGB 14.3 13.9 13.1  HCT 40.4 41.0 38.7*  MCV 84.3 87.2 87.2  PLT 337 299 301   Cardiac Enzymes: No results for input(s): CKTOTAL, CKMB, CKMBINDEX, TROPONINI in the last 168 hours. BNP: Invalid input(s): POCBNP CBG: Recent Labs  Lab 09/07/20 0128 09/07/20 0340 09/07/20 0512 09/07/20 0639 09/07/20 0750  GLUCAP 167* 180* 228* 195* 152*   HbA1C: No results for input(s): HGBA1C in the last 72 hours. Urine analysis:    Component Value Date/Time   COLORURINE STRAW (A) 09/06/2020 1845   APPEARANCEUR CLEAR 09/06/2020 1845   LABSPEC 1.012 09/06/2020 1845   PHURINE 5.0 09/06/2020 1845   GLUCOSEU >=500 (A) 09/06/2020 1845   HGBUR SMALL (A) 09/06/2020 1845   BILIRUBINUR NEGATIVE 09/06/2020 1845   KETONESUR 5 (A) 09/06/2020 1845   PROTEINUR 30 (A) 09/06/2020 1845   NITRITE NEGATIVE 09/06/2020 1845   LEUKOCYTESUR NEGATIVE 09/06/2020 1845   Sepsis Labs: @LABRCNTIP (procalcitonin:4,lacticidven:4) ) Recent Results (from the past 240 hour(s))  Resp Panel by RT-PCR (Flu A&B, Covid) Nasopharyngeal Swab     Status: None   Collection Time: 09/06/20  3:20 PM   Specimen: Nasopharyngeal Swab; Nasopharyngeal(NP) swabs in vial transport medium  Result Value Ref Range Status   SARS Coronavirus 2 by RT PCR NEGATIVE NEGATIVE Final    Comment: (NOTE) SARS-CoV-2 target nucleic acids are NOT DETECTED.  The SARS-CoV-2 RNA is generally  detectable in upper respiratory specimens during the acute phase of infection. The lowest concentration of SARS-CoV-2 viral copies this assay can detect is 138 copies/mL. A negative result does not preclude SARS-Cov-2 infection and should not be used as the sole basis for treatment or other patient management decisions. A negative result may occur with  improper specimen collection/handling, submission of specimen other than nasopharyngeal swab, presence of viral mutation(s) within the areas targeted by this assay, and inadequate number of viral copies(<138 copies/mL). A negative result must be combined with clinical observations, patient history, and epidemiological information. The expected result is Negative.  Fact Sheet for Patients:  EntrepreneurPulse.com.au  Fact Sheet for Healthcare Providers:  IncredibleEmployment.be  This test is no t yet approved or cleared by the  [place] Architectural technologist and  has been authorized for detection and/or diagnosis of SARS-CoV-2 by FDA under an Print production planner (EUA). This EUA will remain  in effect (meaning this test can be used) for the duration of the COVID-19 declaration under Section 564(b)(1) of the Act, 21 U.S.C.section 360bbb-3(b)(1), unless the authorization is terminated  or revoked sooner.       Influenza A by PCR NEGATIVE NEGATIVE Final   Influenza B by PCR NEGATIVE NEGATIVE Final    Comment: (NOTE) The Xpert Xpress SARS-CoV-2/FLU/RSV plus assay is intended as an aid in the diagnosis of influenza from Nasopharyngeal swab specimens and should not be used as a sole basis for treatment. Nasal washings and aspirates are unacceptable for Xpert Xpress SARS-CoV-2/FLU/RSV testing.  Fact Sheet for Patients: EntrepreneurPulse.com.au  Fact Sheet for Healthcare Providers: IncredibleEmployment.be  This test is not yet approved or cleared by the [place] FDA  and has been authorized for detection and/or diagnosis of SARS-CoV-2 by FDA under an Emergency Use Authorization (EUA). This EUA will remain in effect (meaning this test can be used) for the duration of the COVID-19 declaration under Section 564(b)(1) of the Act, 21 U.S.C. section 360bbb-3(b)(1), unless the authorization is terminated or revoked.  Performed at [facility], [address] [place], [place] [address]      Scheduled Meds: . amLODipine  10 mg Oral Daily  . aspirin EC  81 mg Oral Daily  . DULoxetine  20 mg Oral QHS  . heparin  5,000 Units Subcutaneous Q8H  . insulin aspart  0-5 Units Subcutaneous QHS  . insulin aspart  0-9 Units Subcutaneous TID WC  . insulin detemir  10 Units Subcutaneous Q24H  . pantoprazole  40 mg Oral Daily  . senna-docusate  2 tablet Oral QHS  . simvastatin  40 mg Oral QHS  . sodium chloride flush  3 mL Intravenous Q12H  . sodium chloride flush  3 mL Intravenous Q12H   Continuous Infusions: . sodium chloride    . dextrose 5% lactated ringers 125 mL/hr at 09/06/20 2101  . insulin Stopped (09/07/20 0221)  . lactated ringers      Procedures/Studies: No results found.  Orson Eva, DO  [facility]  If 7PM-7AM, please contact night-coverage [email or web address] Password [email or web address] 09/07/2020, 8:18 AM   LOS: 0 days

## 2020-09-07 NOTE — ED Notes (Signed)
Attending MD at the bedside

## 2020-09-07 NOTE — Progress Notes (Signed)
Hyperglycemia with anion gap metabolic acidosis suspect some component of diabetic Ketoacidosis   serum glucose was 323, mild ketonuria, bicarb 21 She was started on insulin drip, IV LR with IV potassium per DKA endotool in the ED Anion gap already closed, currently at 12 Continue IV D5 LR and maintain blood glucose within 200-250 mg/dL Heart healthy/carb diet will be provided Subcu insulin 10 units x 1 will be given Continue IV D5 LR for about 2 hours after subcu insulin and check BMP and VBG prior to discontinuing IV drip Notify physician if patient is able to tolerate above without vomiting and patient will be started on basal insulin regimen and sliding scale If patient continues to have nausea/vomiting, patient may require further IV D5 LR and titrated insulin drip pending better control of nausea/vomiting.

## 2020-09-08 DIAGNOSIS — N179 Acute kidney failure, unspecified: Secondary | ICD-10-CM | POA: Diagnosis not present

## 2020-09-08 DIAGNOSIS — E1110 Type 2 diabetes mellitus with ketoacidosis without coma: Secondary | ICD-10-CM | POA: Diagnosis not present

## 2020-09-08 DIAGNOSIS — E1165 Type 2 diabetes mellitus with hyperglycemia: Secondary | ICD-10-CM

## 2020-09-08 DIAGNOSIS — N1832 Chronic kidney disease, stage 3b: Secondary | ICD-10-CM | POA: Diagnosis not present

## 2020-09-08 LAB — BASIC METABOLIC PANEL
Anion gap: 8 (ref 5–15)
BUN: 26 mg/dL — ABNORMAL HIGH (ref 6–20)
CO2: 26 mmol/L (ref 22–32)
Calcium: 9.4 mg/dL (ref 8.9–10.3)
Chloride: 99 mmol/L (ref 98–111)
Creatinine, Ser: 2.23 mg/dL — ABNORMAL HIGH (ref 0.61–1.24)
GFR, Estimated: 35 mL/min — ABNORMAL LOW (ref >60)
Glucose, Bld: 135 mg/dL — ABNORMAL HIGH (ref 70–99)
Potassium: 4 mmol/L (ref 3.5–5.1)
Sodium: 133 mmol/L — ABNORMAL LOW (ref 135–145)

## 2020-09-08 LAB — GLUCOSE, CAPILLARY
Glucose-Capillary: 105 mg/dL — ABNORMAL HIGH (ref 70–99)
Glucose-Capillary: 147 mg/dL — ABNORMAL HIGH (ref 70–99)

## 2020-09-08 LAB — MAGNESIUM: Magnesium: 2.2 mg/dL (ref 1.7–2.4)

## 2020-09-08 LAB — C-PEPTIDE: C-Peptide: 3.3 ng/mL (ref 1.1–4.4)

## 2020-09-08 MED ORDER — INSULIN GLARGINE 100 UNIT/ML ~~LOC~~ SOLN: 10.0000 [IU] | Freq: Every day | SUBCUTANEOUS | 0 refills | Status: AC

## 2020-09-08 MED ORDER — ATORVASTATIN CALCIUM 40 MG PO TABS: 40.0000 mg | ORAL_TABLET | Freq: Every day | ORAL | Status: DC

## 2020-09-08 MED ORDER — ATORVASTATIN CALCIUM 40 MG PO TABS: 40.0000 mg | ORAL_TABLET | Freq: Every day | ORAL | 0 refills | Status: AC

## 2020-09-08 MED ORDER — INSULIN GLARGINE 100 UNIT/ML ~~LOC~~ SOLN
10.0000 [IU] | Freq: Every day | SUBCUTANEOUS | Status: DC
  Filled 2020-09-08 (×3): qty 0.1

## 2020-09-08 NOTE — Progress Notes (Signed)
Nsg Discharge Note  Admit Date:  09/06/2020 Discharge date: 09/08/2020   Jesse Carey to be D/C'd to [facility] per MD order.  AVS completed.  Copy for chart, and copy for patient signed, and dated. Patient/caregiver able to verbalize understanding.  Discharge Medication: Allergies as of 09/08/2020   No Known Allergies     Medication List    STOP taking these medications   glipiZIDE 10 MG 24 hr tablet Commonly known as: GLUCOTROL XL   losartan 25 MG tablet Commonly known as: COZAAR   simvastatin 40 MG tablet Commonly known as: ZOCOR   terbinafine 250 MG tablet Commonly known as: LAMISIL     TAKE these medications   amLODipine 10 MG tablet Commonly known as: NORVASC Take 10 mg by mouth daily.   aspirin EC 81 MG tablet Take 81 mg by mouth daily. Swallow whole.   atorvastatin 40 MG tablet Commonly known as: LIPITOR Take 1 tablet (40 mg total) by mouth daily with supper.   chlorthalidone 25 MG tablet Commonly known as: HYGROTON Take 25 mg by mouth daily.   docusate sodium 100 MG capsule Commonly known as: COLACE Take 100 mg by mouth 2 (two) times daily.   DULoxetine 20 MG capsule Commonly known as: CYMBALTA Take 20 mg by mouth at bedtime.   Fiber 625 MG Tabs Take 625 mg by mouth daily. With 2 glasses of water   insulin glargine 100 UNIT/ML injection Commonly known as: LANTUS Inject 0.1 mLs (10 Units total) into the skin daily. Start taking on: September 09, 2020   metFORMIN 750 MG 24 hr tablet Commonly known as: GLUCOPHAGE-XR Take 750 mg by mouth in the morning and at bedtime.   omeprazole 20 MG capsule Commonly known as: PRILOSEC Take 20 mg by mouth at bedtime.       Discharge Assessment: Vitals:   09/07/20 2022 09/08/20 0455  BP: (!) 141/92 127/82  Pulse: 83 79  Resp: 20 18  Temp: 98.6 F (37 C) (!) 97.1 F (36.2 C)  SpO2: 99% 97%   Skin clean, dry and intact without evidence of skin break down, no evidence of skin tears  noted. IV catheter discontinued intact. Site without signs and symptoms of complications - no redness or edema noted at insertion site, patient denies c/o pain - only slight tenderness at site.  Dressing with slight pressure applied.  D/c Instructions-Education: Discharge instructions given to patient/family with verbalized understanding. D/c education completed with patient/family including follow up instructions, medication list, d/c activities limitations if indicated, with other d/c instructions as indicated by MD - patient able to verbalize understanding, all questions fully answered. Patient instructed to return to ED, call 911, or call MD for any changes in condition.  Patient escorted via [place], and D/C home via private auto.  Dorcas Mcmurray, LPN [id number] 0:99 [id number]

## 2020-09-08 NOTE — Progress Notes (Addendum)
Inpatient Diabetes Program Recommendations  AACE/ADA: New Consensus Statement on Inpatient Glycemic Control (2015)  Target Ranges:  Prepandial:   less than 140 mg/dL      Peak postprandial:   less than 180 mg/dL (1-2 hours)      Critically ill patients:  140 - 180 mg/dL   Lab Results  Component Value Date   GLUCAP 105 (H) 09/08/2020   HGBA1C 10.7 (H) 09/06/2020    Review of Glycemic Control Results for DAMEER, SPEISER (MRN [id number]) as of 09/08/2020 09:05  Ref. Range 09/07/2020 17:37 09/07/2020 21:36 09/08/2020 08:06  Glucose-Capillary Latest Ref Range: 70 - 99 mg/dL 130 (H) 137 (H) 105 (H)   Diabetes history: DM2 Outpatient Diabetes medications: Glucotrol XL 10 mg + Glucophage XL 750 mg bid Current orders for Inpatient glycemic control: Levemir 10 units + Novolog 0-9 units tid + 0-5 units hs  Inpatient Diabetes Program Recommendations:   Noted GFR 28 so will limit receiving Metformin @ discharge.  Trending well on current regimen. However, would anticipate need for insulin at discharge could consider Novolog 70/30 6 units BID.   Will attempt to speak with patient.  Addendum: Spoke with patient regarding use of insulin at correction facility.  Reviewed patient's current A1c of 10.7%. Explained what a A1c is and what it measures. Also reviewed goal A1c with patient, importance of good glucose control @ home, and blood sugar goals. Reviewed survival skills, interventions, hyper vs hypo glycemia, when to reach out to MD at facility, NPH vs 70/30, when to take, DKA, vascular changes and comorbidites.  Patient will have glucose checked twice daily. Informed of target values and when to consider discussing dose changes.  Admits to drinking Kool-Aid. Encouraged alternatives, plate method, and reasons for eliminating sugary beverages. Patient expresses understanding and has no further questions.  Secure chat sent to MD regarding insulin at discharge.  Thanks, Bronson Curb, MSN,  [facility] Diabetes Coordinator [phone] (8a-5p)

## 2020-09-08 NOTE — Discharge Summary (Signed)
Physician Discharge Summary  Jaron Czarnecki OYD:[id number] DOB: 01-14-1971 DOA: 09/06/2020  PCP: Pcp, No  Admit date: 09/06/2020 Discharge date: 09/08/2020  Admitted From: [place] Disposition:  [place]  Recommendations for Outpatient Follow-up:  1. Follow up with PCP in 1-2 weeks 2. Please obtain BMP/CBC in one week   Discharge Condition: Stable CODE STATUS: FULL Diet recommendation: Heart Healthy / Carb Modified   Brief/Interim Summary: 49 year old male with a history of diabetes mellitus type 2, hypertension, hyperlipidemia and CKD presenting with 5 to 6-day history of generalized weakness with nausea and dry heaving.  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  He states that for the past week his sugars have been running 400-500 range.  He is states that for the past 2 weeks he has been treated for some type of " stomach condition".  He states this has improved, and he no longer has abdominal pain.  He denies any fevers, chills, chest pain, shortness breath, cough, hemoptysis, abdominal pain, dysuria, hematuria, hematochezia, melena. Upon presentation, the patient was noted to have a serum glucose of 323 with anion gap of 20.  It was noted to have ketonuria and elevated BHA.  He was started on IV insulin and IV fluids. Notably, the patient states that he was diagnosed with diabetes mellitus while he was in jail in 2013.  He has never been on insulin.  He currently is being treated with Metformin and glipizide.  He is not aware of any first-degree relatives with diabetes mellitus.  Discharge Diagnoses:  DKA type II -patient started on IV insulin with q 1 hour CBG check and q 4 hour BMPs -pt started on aggressive fluid resuscitation -Electrolytes were monitored and repleted -transitioned to [place] insulin once anion gap closed -diet was advanced once anion gap closed, which he  tolerated -HbA1C--10.7 -Check C-peptide 3.3 -antiinsulin antibodies--pending at time of d/c -discharge with Lantus 10 units daily -restart metformin -d/c glipizide for now and monitor CBGs after d/c  Essential hypertension -Continue amlodipine -resume chlorthalidone after d/c  Hyperlipidemia -Continue statin -check lipid panel--LDL205 -escalate therapy to atorvastatin -d/c simvastatin  Depression/anxiety -Continue Cymbalta  Hyponatremia -Secondary to hypoglycemia and volume depletion -Continue IV fluids -improved  Acute on chronic renal failure--CKD 3b -CKD stage unknown presently -Renal ultrasound--no hydronephrosis -Continue IV fluids -Serum creatinine peaked at 3.61 -baseline creatinine likely 2.2-2.5 -serum creatinine 2.23 on day of d/c    Discharge Instructions   Allergies as of 09/08/2020   No Known Allergies     Medication List    STOP taking these medications   glipiZIDE 10 MG 24 hr tablet Commonly known as: GLUCOTROL XL   losartan 25 MG tablet Commonly known as: COZAAR   simvastatin 40 MG tablet Commonly known as: ZOCOR   terbinafine 250 MG tablet Commonly known as: LAMISIL     TAKE these medications   amLODipine 10 MG tablet Commonly known as: NORVASC Take 10 mg by mouth daily.   aspirin EC 81 MG tablet Take 81 mg by mouth daily. Swallow whole.   atorvastatin 40 MG tablet Commonly known as: LIPITOR Take 1 tablet (40 mg total) by mouth daily with supper.   chlorthalidone 25 MG tablet Commonly known as: HYGROTON Take 25 mg by mouth daily.   docusate sodium 100 MG capsule Commonly known as: COLACE Take 100 mg by mouth 2 (two) times daily.   DULoxetine 20 MG capsule Commonly known as: CYMBALTA Take 20 mg by  mouth at bedtime.   Fiber 625 MG Tabs Take 625 mg by mouth daily. With 2 glasses of water   insulin glargine 100 UNIT/ML injection Commonly known as: LANTUS Inject 0.1 mLs (10 Units total) into the skin  daily. Start taking on: September 09, 2020   metFORMIN 750 MG 24 hr tablet Commonly known as: GLUCOPHAGE-XR Take 750 mg by mouth in the morning and at bedtime.   omeprazole 20 MG capsule Commonly known as: PRILOSEC Take 20 mg by mouth at bedtime.       No Known Allergies  Consultations:  none   Procedures/Studies: US RENAL  Result Date: 09/07/2020 CLINICAL DATA:  Acute on chronic kidney injury. EXAM: RENAL / URINARY TRACT ULTRASOUND COMPLETE COMPARISON:  None. FINDINGS: Right Kidney: Renal measurements: 9.5 x 4.5 x 5.9 cm = volume: 131 mL. Echogenicity within normal limits. No mass or hydronephrosis visualized. Left Kidney: Renal measurements: 10.3 x 5.4 x 5.1 cm = volume: 148 mL. Echogenicity within normal limits. No mass or hydronephrosis visualized. Bladder: Appears normal for degree of bladder distention. Bilateral ureteral jets seen in the bladder. Other: Echogenic right liver parenchyma. IMPRESSION: 1. No hydronephrosis.  Normal kidneys and bladder. 2. Incidentally noted echogenic liver parenchyma, a nonspecific finding that could be due to hepatic steatosis or fibrosis. Suggest correlation with liver function tests. Consider hepatic elastography for further liver fibrosis risk stratification, as clinically warranted. Electronically Signed   By: Ilona Sorrel M.D.   On: 09/07/2020 09:31         Discharge Exam: Vitals:   09/07/20 2022 09/08/20 0455  BP: (!) 141/92 127/82  Pulse: 83 79  Resp: 20 18  Temp: 98.6 F (37 C) (!) 97.1 F (36.2 C)  SpO2: 99% 97%   Vitals:   09/07/20 1647 09/07/20 1741 09/07/20 2022 09/08/20 0455  BP:  (!) 119/94 (!) 141/92 127/82  Pulse:  93 83 79  Resp:  20 20 18   Temp: 98.5 F (36.9 C) 99.3 F (37.4 C) 98.6 F (37 C) (!) 97.1 F (36.2 C)  TempSrc: Oral Oral Oral   SpO2:  100% 99% 97%  Weight:  80.8 kg    Height:  5\' 8"  (1.727 m)      General: Pt is alert, awake, not in acute distress Cardiovascular: RRR, S1/S2 +, no rubs, no  gallops Respiratory: CTA bilaterally, no wheezing, no rhonchi Abdominal: Soft, NT, ND, bowel sounds + Extremities: no edema, no cyanosis   The results of significant diagnostics from this hospitalization (including imaging, microbiology, ancillary and laboratory) are listed below for reference.    Significant Diagnostic Studies: US RENAL  Result Date: 09/07/2020 CLINICAL DATA:  Acute on chronic kidney injury. EXAM: RENAL / URINARY TRACT ULTRASOUND COMPLETE COMPARISON:  None. FINDINGS: Right Kidney: Renal measurements: 9.5 x 4.5 x 5.9 cm = volume: 131 mL. Echogenicity within normal limits. No mass or hydronephrosis visualized. Left Kidney: Renal measurements: 10.3 x 5.4 x 5.1 cm = volume: 148 mL. Echogenicity within normal limits. No mass or hydronephrosis visualized. Bladder: Appears normal for degree of bladder distention. Bilateral ureteral jets seen in the bladder. Other: Echogenic right liver parenchyma. IMPRESSION: 1. No hydronephrosis.  Normal kidneys and bladder. 2. Incidentally noted echogenic liver parenchyma, a nonspecific finding that could be due to hepatic steatosis or fibrosis. Suggest correlation with liver function tests. Consider hepatic elastography for further liver fibrosis risk stratification, as clinically warranted. Electronically Signed   By: Ilona Sorrel M.D.   On: 09/07/2020 09:31  Microbiology: Recent Results (from the past 240 hour(s))  Resp Panel by RT-PCR (Flu A&B, Covid) Nasopharyngeal Swab     Status: None   Collection Time: 09/06/20  3:20 PM   Specimen: Nasopharyngeal Swab; Nasopharyngeal(NP) swabs in vial transport medium  Result Value Ref Range Status   SARS Coronavirus 2 by RT PCR NEGATIVE NEGATIVE Final    Comment: (NOTE) SARS-CoV-2 target nucleic acids are NOT DETECTED.  The SARS-CoV-2 RNA is generally detectable in upper respiratory specimens during the acute phase of infection. The lowest concentration of SARS-CoV-2 viral copies this assay can  detect is 138 copies/mL. A negative result does not preclude SARS-Cov-2 infection and should not be used as the sole basis for treatment or other patient management decisions. A negative result may occur with  improper specimen collection/handling, submission of specimen other than nasopharyngeal swab, presence of viral mutation(s) within the areas targeted by this assay, and inadequate number of viral copies(<138 copies/mL). A negative result must be combined with clinical observations, patient history, and epidemiological information. The expected result is Negative.  Fact Sheet for Patients:  EntrepreneurPulse.com.au  Fact Sheet for Healthcare Providers:  IncredibleEmployment.be  This test is no t yet approved or cleared by the [place] FDA and  has been authorized for detection and/or diagnosis of SARS-CoV-2 by FDA under an Emergency Use Authorization (EUA). This EUA will remain  in effect (meaning this test can be used) for the duration of the COVID-19 declaration under Section 564(b)(1) of the Act, 21 U.S.C.section 360bbb-3(b)(1), unless the authorization is terminated  or revoked sooner.       Influenza A by PCR NEGATIVE NEGATIVE Final   Influenza B by PCR NEGATIVE NEGATIVE Final    Comment: (NOTE) The Xpert Xpress SARS-CoV-2/FLU/RSV plus assay is intended as an aid in the diagnosis of influenza from Nasopharyngeal swab specimens and should not be used as a sole basis for treatment. Nasal washings and aspirates are unacceptable for Xpert Xpress SARS-CoV-2/FLU/RSV testing.  Fact Sheet for Patients: EntrepreneurPulse.com.au  Fact Sheet for Healthcare Providers: IncredibleEmployment.be  This test is not yet approved or cleared by the [place] FDA and has been authorized for detection and/or diagnosis of SARS-CoV-2 by FDA under an Emergency Use Authorization (EUA). This EUA will remain in  effect (meaning this test can be used) for the duration of the COVID-19 declaration under Section 564(b)(1) of the Act, 21 U.S.C. section 360bbb-3(b)(1), unless the authorization is terminated or revoked.  Performed at [facility], [address], [place], [place] [address]      Labs: Basic Metabolic Panel: Recent Labs  Lab 09/06/20 2251 09/07/20 0231 09/07/20 0711 09/07/20 1114 09/08/20 0615  NA 134* 135 133* 133* 133*  K 4.0 4.0 3.9 3.9 4.0  CL 95* 97* 97* 95* 99  CO2 27 27 25 26 26   GLUCOSE 144* 143* 179* 174* 135*  BUN 44* 40* 36* 32* 26*  CREATININE 2.93* 2.89* 2.65* 2.54* 2.23*  CALCIUM 9.6 9.8 9.8 9.5 9.4  MG  --   --   --   --  2.2   Liver Function Tests: Recent Labs  Lab 09/06/20 1158 09/07/20 0231  AST 19 18  ALT 14 12  ALKPHOS 92 80  BILITOT 1.1 1.1  PROT 9.2* 7.9  ALBUMIN 5.4* 4.6   Recent Labs  Lab 09/06/20 1158  LIPASE 35   No results for input(s): AMMONIA in the last 168 hours. CBC: Recent Labs  Lab 09/06/20 1158 09/06/20 1922 09/07/20 0231  WBC 8.7  9.3 10.0  HGB 14.3 13.9 13.1  HCT 40.4 41.0 38.7*  MCV 84.3 87.2 87.2  PLT 337 299 301   Cardiac Enzymes: No results for input(s): CKTOTAL, CKMB, CKMBINDEX, TROPONINI in the last 168 hours. BNP: Invalid input(s): POCBNP CBG: Recent Labs  Lab 09/07/20 0853 09/07/20 1227 09/07/20 1737 09/07/20 2136 09/08/20 0806  GLUCAP 127* 140* 130* 137* 105*    Time coordinating discharge:  36 minutes  Signed:  Orson Eva, DO [facility] Pager: [phone] 09/08/2020, 9:10 AM

## 2020-09-14 LAB — INSULIN ANTIBODIES, BLOOD: Insulin Antibodies, Human: <5 uU/mL

## 2021-12-26 IMAGING — US US RENAL
1 series · 14 of 25 positions shown · non-contrast
Comparison: None.

CLINICAL DATA: Acute on chronic kidney injury.

EXAM:
RENAL / URINARY TRACT ULTRASOUND COMPLETE

[Series 1: us renal · 14 of 46 slices shown]
[im 1/46]
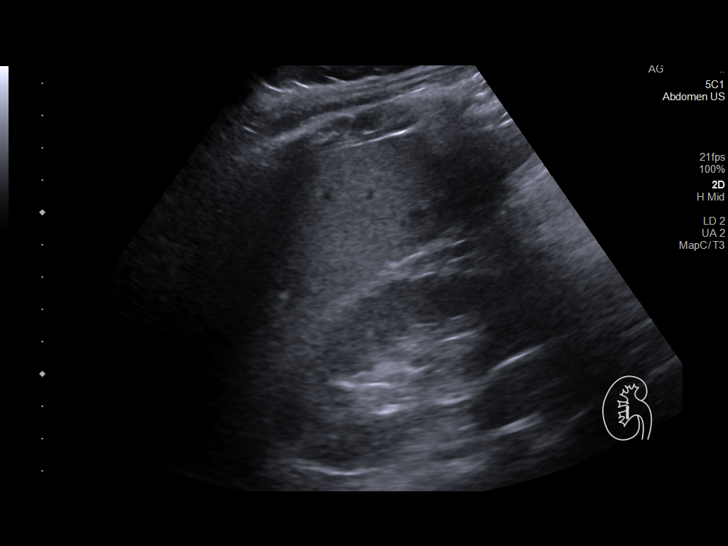
[im 4/46]
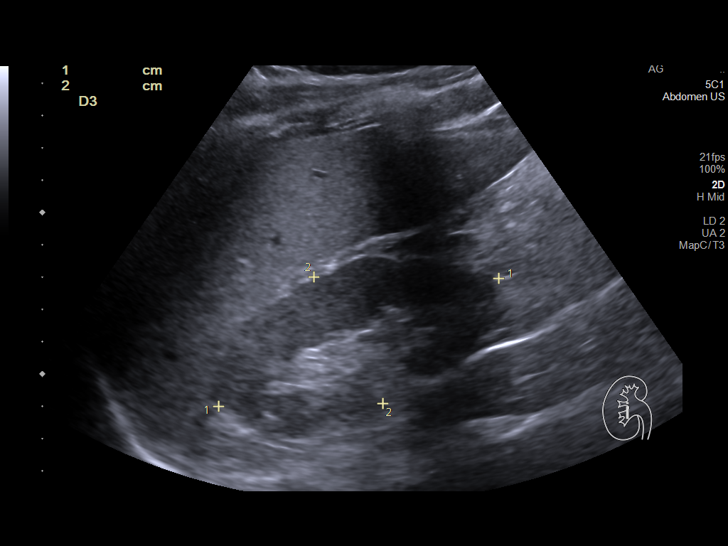
[im 8/46]
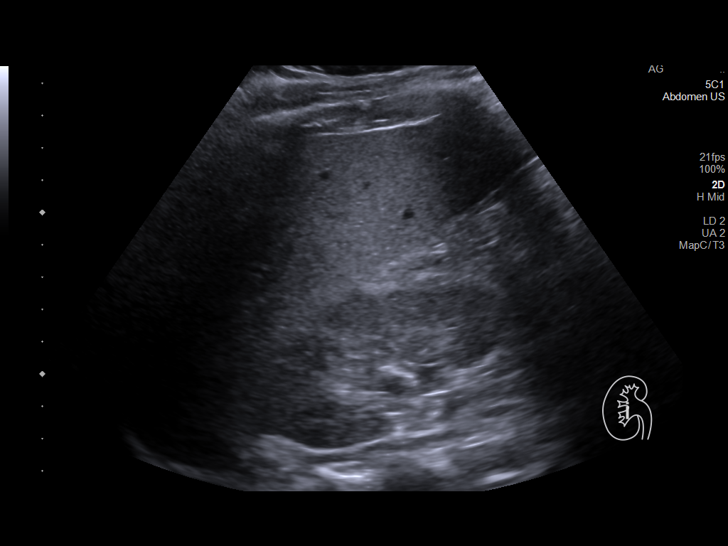
[im 12/46]
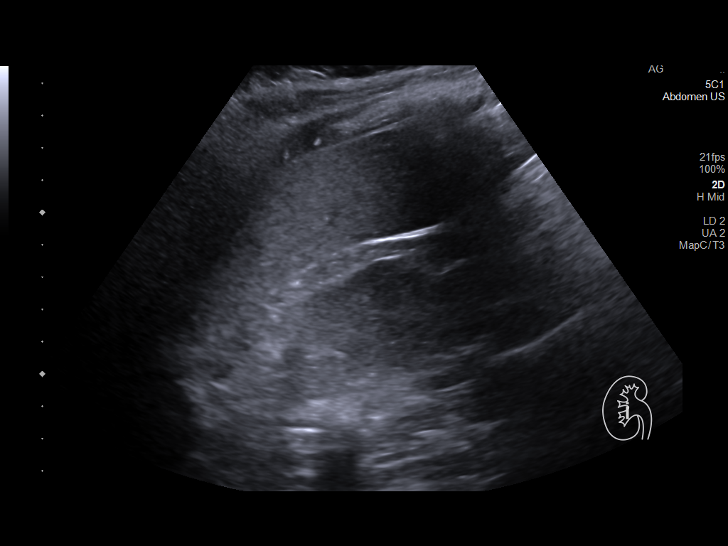
[im 16/46]
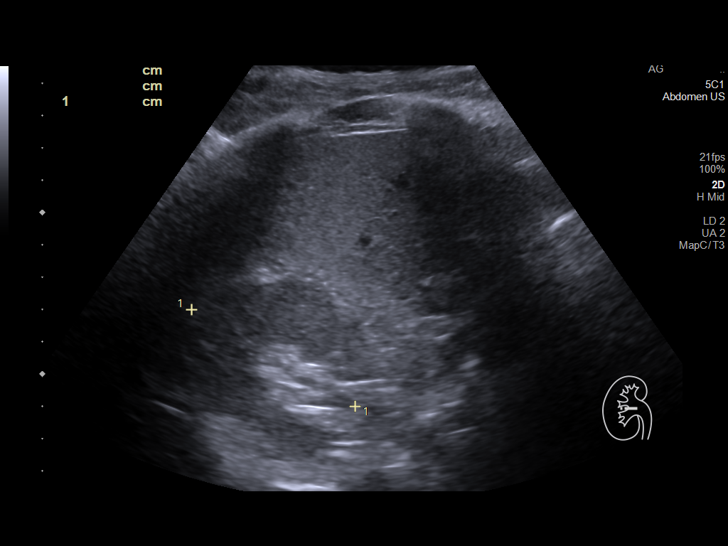
[im 17/46]
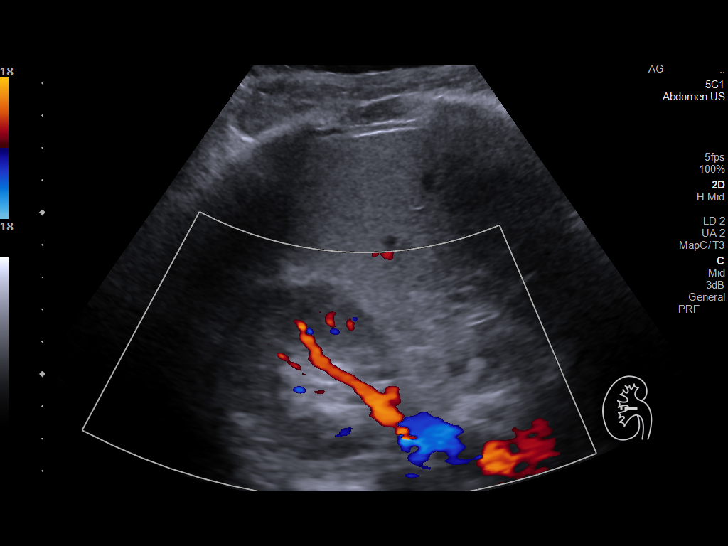
[im 21/46]
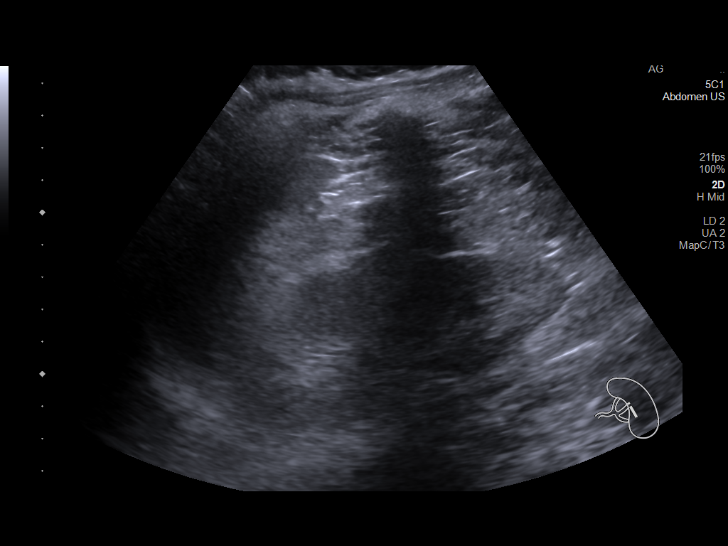
[im 25/46]
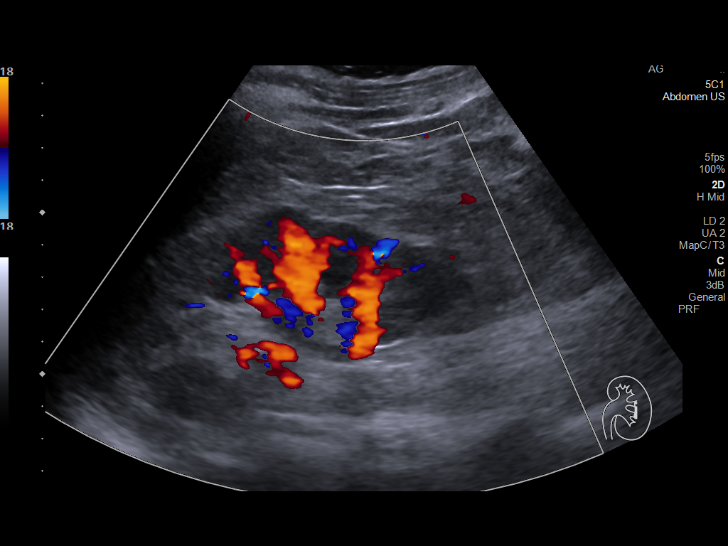
[im 29/46]
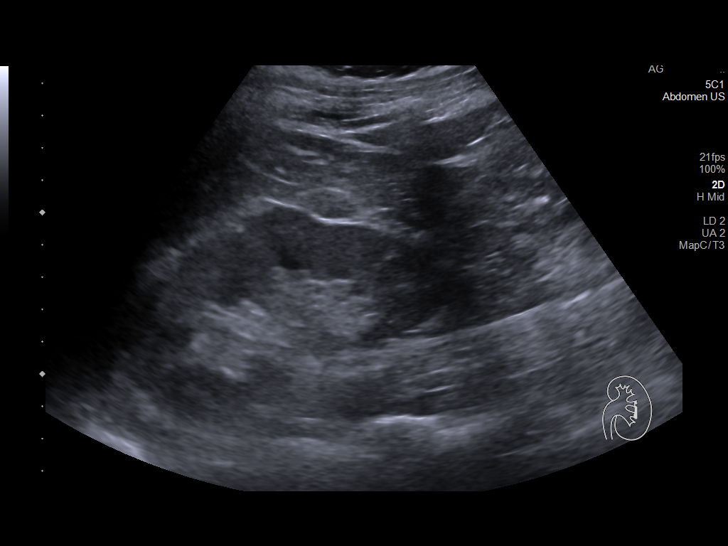
[im 31/46]
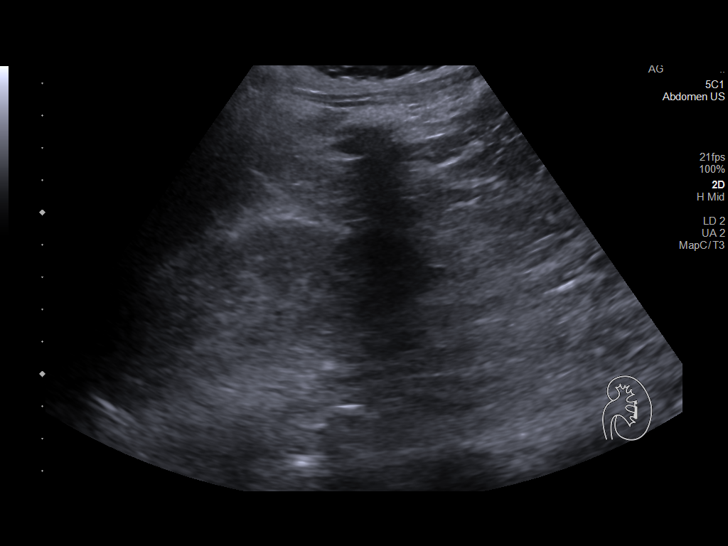
[im 34/46]
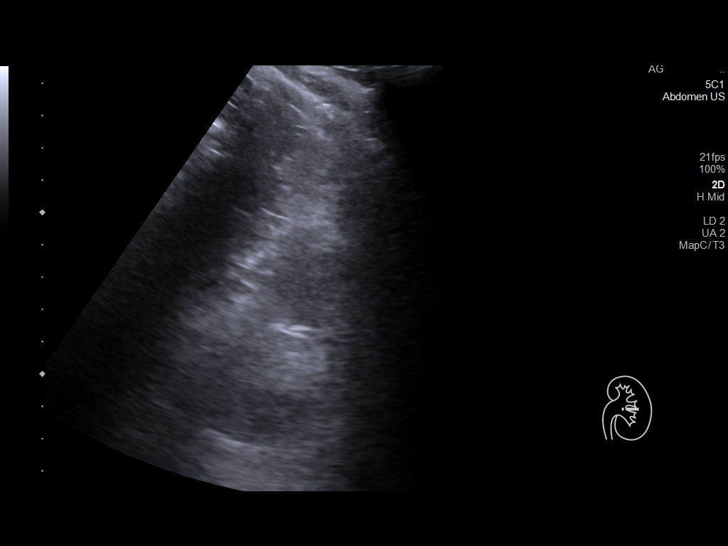
[im 38/46]
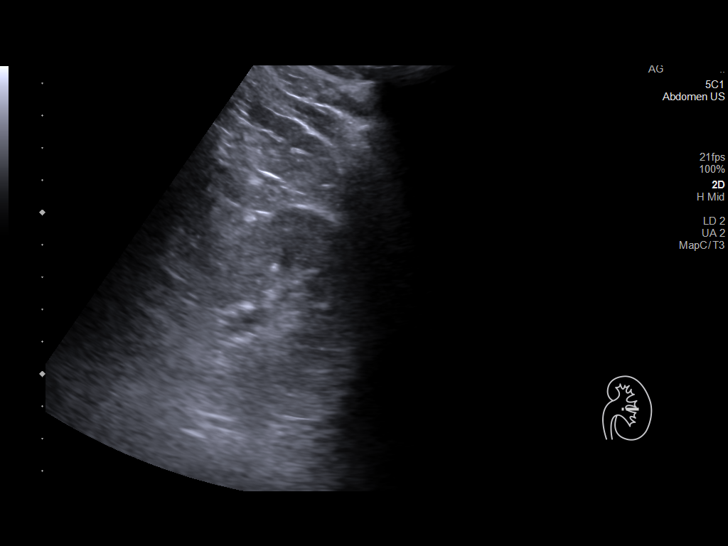
[im 42/46]
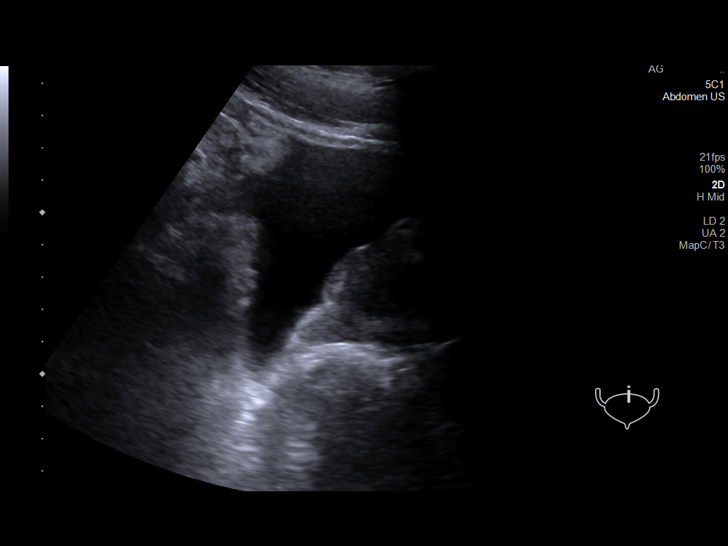
[im 46/46]
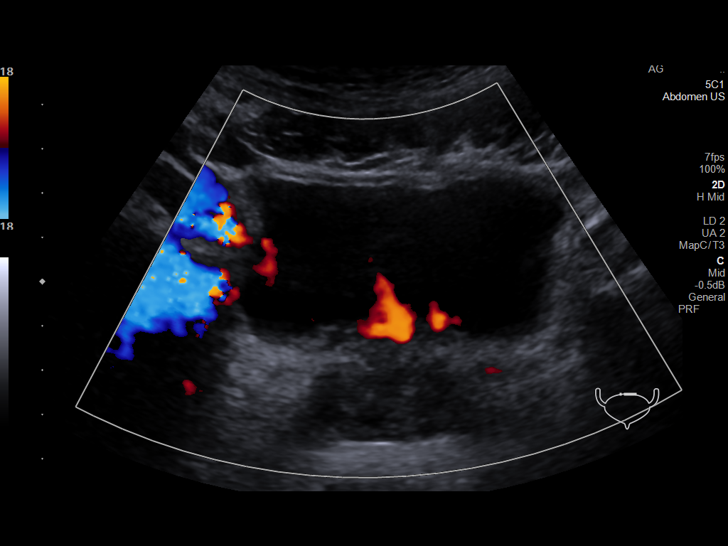

[14 of 25 positions shown; findings below may reference images not displayed]

FINDINGS: Right Kidney:

Renal measurements: 9.5 x 4.5 x 5.9 cm = volume: 131 mL.
Echogenicity within normal limits. No mass or hydronephrosis
visualized.

Left Kidney:

Renal measurements: 10.3 x 5.4 x 5.1 cm = volume: 148 mL.
Echogenicity within normal limits. No mass or hydronephrosis
visualized.

Bladder:

Appears normal for degree of bladder distention. Bilateral ureteral
jets seen in the bladder.

Other:

Echogenic right liver parenchyma.
IMPRESSION: 1. No hydronephrosis.  Normal kidneys and bladder.
2. Incidentally noted echogenic liver parenchyma, a nonspecific
finding that could be due to hepatic steatosis or fibrosis. Suggest
correlation with liver function tests. Consider hepatic elastography
for further liver fibrosis risk stratification, as clinically
warranted.
